# Patient Record
Sex: Female | Born: 1975 | Race: White | Hispanic: No | Marital: Married | Smoking: Never smoker
Health system: Southern US, Community
[De-identification: ages and names within clinical notes are randomized; demographics above are authoritative.]

## PROBLEM LIST (undated history)

## (undated) DIAGNOSIS — E785 Hyperlipidemia, unspecified: Secondary | ICD-10-CM

## (undated) DIAGNOSIS — J302 Other seasonal allergic rhinitis: Secondary | ICD-10-CM

## (undated) DIAGNOSIS — F419 Anxiety disorder, unspecified: Secondary | ICD-10-CM

## (undated) DIAGNOSIS — Z975 Presence of (intrauterine) contraceptive device: Secondary | ICD-10-CM

## (undated) DIAGNOSIS — J069 Acute upper respiratory infection, unspecified: Secondary | ICD-10-CM

## (undated) HISTORY — DX: Presence of (intrauterine) contraceptive device: Z97.5

## (undated) HISTORY — PX: WISDOM TOOTH EXTRACTION: SHX21

## (undated) HISTORY — DX: Anxiety disorder, unspecified: F41.9

## (undated) HISTORY — DX: Hyperlipidemia, unspecified: E78.5

## (undated) HISTORY — DX: Acute upper respiratory infection, unspecified: J06.9

## (undated) HISTORY — PX: TONSILLECTOMY AND ADENOIDECTOMY: SUR1326

## (undated) HISTORY — DX: Other seasonal allergic rhinitis: J30.2

---

## 2002-07-11 ENCOUNTER — Other Ambulatory Visit: Admission: RE | Admit: 2002-07-11 | Discharge: 2002-07-11 | Payer: Self-pay | Admitting: Obstetrics and Gynecology

## 2003-08-10 ENCOUNTER — Other Ambulatory Visit: Admission: RE | Admit: 2003-08-10 | Discharge: 2003-08-10 | Payer: Self-pay | Admitting: Obstetrics and Gynecology

## 2005-06-11 ENCOUNTER — Ambulatory Visit (HOSPITAL_COMMUNITY): Admission: RE | Admit: 2005-06-11 | Discharge: 2005-06-11 | Payer: Self-pay | Admitting: Obstetrics and Gynecology

## 2006-06-07 ENCOUNTER — Inpatient Hospital Stay (HOSPITAL_COMMUNITY): Admission: AD | Admit: 2006-06-07 | Discharge: 2006-06-07 | Payer: Self-pay | Admitting: Obstetrics and Gynecology

## 2006-07-06 ENCOUNTER — Inpatient Hospital Stay (HOSPITAL_COMMUNITY): Admission: AD | Admit: 2006-07-06 | Discharge: 2006-07-09 | Payer: Self-pay | Admitting: Obstetrics and Gynecology

## 2010-03-03 ENCOUNTER — Inpatient Hospital Stay (HOSPITAL_COMMUNITY): Admission: AD | Admit: 2010-03-03 | Discharge: 2010-03-03 | Payer: Self-pay | Admitting: Obstetrics

## 2010-04-13 ENCOUNTER — Inpatient Hospital Stay (HOSPITAL_COMMUNITY): Admission: AD | Admit: 2010-04-13 | Discharge: 2010-04-15 | Payer: Self-pay | Admitting: Obstetrics

## 2010-12-29 LAB — CBC
HCT: 32.3 % — ABNORMAL LOW (ref 36.0–46.0)
Hemoglobin: 12.3 g/dL (ref 12.0–15.0)
MCH: 33.2 pg (ref 26.0–34.0)
MCV: 95.8 fL (ref 78.0–100.0)
Platelets: 215 10*3/uL (ref 150–400)
Platelets: 249 10*3/uL (ref 150–400)
RBC: 3.37 MIL/uL — ABNORMAL LOW (ref 3.87–5.11)
RBC: 3.73 MIL/uL — ABNORMAL LOW (ref 3.87–5.11)
RDW: 12.9 % (ref 11.5–15.5)
RDW: 13.1 % (ref 11.5–15.5)
WBC: 11.7 10*3/uL — ABNORMAL HIGH (ref 4.0–10.5)
WBC: 13.3 10*3/uL — ABNORMAL HIGH (ref 4.0–10.5)

## 2010-12-30 LAB — URINE CULTURE
Colony Count: NO GROWTH
Culture: NO GROWTH

## 2010-12-30 LAB — FETAL FIBRONECTIN: Fetal Fibronectin: NEGATIVE

## 2010-12-30 LAB — URINALYSIS, ROUTINE W REFLEX MICROSCOPIC

## 2013-05-06 ENCOUNTER — Ambulatory Visit (HOSPITAL_BASED_OUTPATIENT_CLINIC_OR_DEPARTMENT_OTHER)
Admission: RE | Admit: 2013-05-06 | Discharge: 2013-05-06 | Disposition: A | Discharge status: Home / Self Care | Payer: 59 | Source: Ambulatory Visit | Attending: Family Medicine | Admitting: Family Medicine

## 2013-05-06 ENCOUNTER — Ambulatory Visit (INDEPENDENT_AMBULATORY_CARE_PROVIDER_SITE_OTHER): Payer: 59 | Admitting: Family Medicine

## 2013-05-06 ENCOUNTER — Encounter: Payer: Self-pay | Admitting: Family Medicine

## 2013-05-06 VITALS — BP 108/73 | HR 70 | Wt 112.0 lb

## 2013-05-06 DIAGNOSIS — M51379 Other intervertebral disc degeneration, lumbosacral region without mention of lumbar back pain or lower extremity pain: Secondary | ICD-10-CM | POA: Insufficient documentation

## 2013-05-06 DIAGNOSIS — Z569 Unspecified problems related to employment: Secondary | ICD-10-CM

## 2013-05-06 DIAGNOSIS — M545 Low back pain, unspecified: Secondary | ICD-10-CM | POA: Insufficient documentation

## 2013-05-06 DIAGNOSIS — M5137 Other intervertebral disc degeneration, lumbosacral region: Secondary | ICD-10-CM | POA: Insufficient documentation

## 2013-05-06 DIAGNOSIS — G4700 Insomnia, unspecified: Secondary | ICD-10-CM

## 2013-05-06 DIAGNOSIS — M549 Dorsalgia, unspecified: Secondary | ICD-10-CM

## 2013-05-06 DIAGNOSIS — Z566 Other physical and mental strain related to work: Secondary | ICD-10-CM

## 2013-05-06 DIAGNOSIS — F411 Generalized anxiety disorder: Secondary | ICD-10-CM

## 2013-05-06 MED ORDER — ZOLPIDEM TARTRATE 10 MG PO TABS: 10.0000 mg | ORAL_TABLET | Freq: Every evening | ORAL | Status: DC | PRN

## 2013-05-06 MED ORDER — ESCITALOPRAM OXALATE 5 MG PO TABS: 5.0000 mg | ORAL_TABLET | Freq: Every day | ORAL | Status: DC

## 2013-05-06 NOTE — Progress Notes (Signed)
  Subjective:    Patient ID: Hayley Reynolds, female    DOB: 1976-08-27, 37 y.o.   MRN: [id number]  HPI  Hayley Reynolds is here today to discuss a few issues:   1)  Insomnia/Stress.:  She has struggled with this problem for year.  She feels that her sleeping disturbances are due to her stress.  She feels very fatigued during the day and she feels that her cognition is adversely affected by her lack of sleep. She has tried Melatonin which has helped her some.  She has aslo take Ambien which works well for her. She feels that her insomnia may be related to her stress/anxiety.  She has noticed that her symptoms are worse when she consumes caffeine.     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  She has tried doing back exercises which have not helped her very much.   Review of Systems  Constitutional: Positive for fatigue. Negative for unexpected weight change.  HENT: Negative.   Respiratory: Negative.   Cardiovascular: Negative.   Gastrointestinal: Negative.   Genitourinary: Negative.   Musculoskeletal: Positive for back pain.  Psychiatric/Behavioral: Positive for sleep disturbance and decreased concentration. The patient is nervous/anxious.     Past Medical History  Diagnosis Date  . Anxiety   . IUD (intrauterine device) in place   . Acute upper respiratory infections of other multiple sites    Family History  Problem Relation Age of Onset  . Heart disease Mother   . Hyperlipidemia Mother   . Asthma Sister   . Heart disease Maternal Grandmother   . 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  Neck: Normal range of motion. Neck supple.  Cardiovascular: Normal rate, regular rhythm and normal heart sounds.   Pulmonary/Chest: Effort normal and breath sounds normal.  Musculoskeletal:       Lumbar back: She exhibits decreased range of motion, tenderness and spasm. She exhibits no edema and no deformity.  Neurological: She has normal reflexes. She exhibits normal muscle tone. Coordination normal.  Skin: No rash noted.  Psychiatric: Her speech is normal and behavior is normal. Judgment and thought content normal. Her mood appears anxious (Stressed ). Cognition and memory are normal.          Assessment & Plan:

## 2013-05-06 NOTE — Patient Instructions (Addendum)
1)  Anxiety - Start on Lexapro 2.5 mg for a week then increase to 1 tab.  2)  Insomnia - Take 1/2 of an Ambien occasionally as needed.    3)  Back Pain - X-ray showed mild narrowing between L4-5.  You may decide to do PT vs chiropractic care.       Back Exercises Back exercises help treat and prevent back injuries. The goal of back exercises is to increase the strength of your abdominal and back muscles and the flexibility of your back. These exercises should be started when you no longer have back pain. Back exercises include:  Pelvic Tilt. Lie on your back with your knees bent. Tilt your pelvis until the lower part of your back is against the floor. Hold this position 5 to 10 sec and repeat 5 to 10 times.  Knee to Chest. Pull first 1 knee up against your chest and hold for 20 to 30 seconds, repeat this with the other knee, and then both knees. This may be done with the other leg straight or bent, whichever feels better.  Sit-Ups or Curl-Ups. Bend your knees 90 degrees. Start with tilting your pelvis, and do a partial, slow sit-up, lifting your trunk only 30 to 45 degrees off the floor. Take at least 2 to 3 seconds for each sit-up. Do not do sit-ups with your knees out straight. If partial sit-ups are difficult, simply do the above but with only tightening your abdominal muscles and holding it as directed.  Hip-Lift. Lie on your back with your knees flexed 90 degrees. Push down with your feet and shoulders as you raise your hips a couple inches off the floor; hold for 10 seconds, repeat 5 to 10 times.  Back arches. Lie on your stomach, propping yourself up on bent elbows. Slowly press on your hands, causing an arch in your low back. Repeat 3 to 5 times. Any initial stiffness and discomfort should lessen with repetition over time.  Shoulder-Lifts. Lie face down with arms beside your body. Keep hips and torso pressed to floor as you slowly lift your head and shoulders off the floor. Do not  overdo your exercises, especially in the beginning. Exercises may cause you some mild back discomfort which lasts for a few minutes; however, if the pain is more severe, or lasts for more than 15 minutes, do not continue exercises until you see your caregiver. Improvement with exercise therapy for back problems is slow.  See your caregivers for assistance with developing a proper back exercise program. Document Released: 11/06/2004 Document Revised: 12/22/2011 Document Reviewed: 07/31/2011 [facility] Patient Information 2014 [place], [place].

## 2013-06-06 ENCOUNTER — Ambulatory Visit (INDEPENDENT_AMBULATORY_CARE_PROVIDER_SITE_OTHER): Payer: 59 | Admitting: Family Medicine

## 2013-06-06 ENCOUNTER — Encounter: Payer: Self-pay | Admitting: Family Medicine

## 2013-06-06 VITALS — BP 113/75 | HR 54 | Resp 16 | Ht 62.0 in | Wt 116.0 lb

## 2013-06-06 DIAGNOSIS — M549 Dorsalgia, unspecified: Secondary | ICD-10-CM | POA: Insufficient documentation

## 2013-06-06 DIAGNOSIS — F411 Generalized anxiety disorder: Secondary | ICD-10-CM

## 2013-06-06 DIAGNOSIS — G4700 Insomnia, unspecified: Secondary | ICD-10-CM | POA: Insufficient documentation

## 2013-06-06 DIAGNOSIS — Z566 Other physical and mental strain related to work: Secondary | ICD-10-CM | POA: Insufficient documentation

## 2013-06-06 MED ORDER — ESCITALOPRAM OXALATE 5 MG PO TABS: 5.0000 mg | ORAL_TABLET | Freq: Every day | ORAL | Status: DC

## 2013-06-06 NOTE — Progress Notes (Signed)
  Subjective:    Patient ID: Hayley Reynolds, female    DOB: 18-Apr-1976, 37 y.o.   MRN: [id number]  HPI  Hayley Reynolds is here today for a refill of her Lexapro.  Her mood is good on this medication and she would like to remain on it.     Review of Systems  Constitutional: Negative.   HENT: Negative.   Eyes: Negative.   Respiratory: Negative.   Cardiovascular: Negative.   Gastrointestinal: Negative.   Endocrine: Negative.   Genitourinary: Negative.   Musculoskeletal: Negative.   Skin: Negative.   Allergic/Immunologic: Negative.   Neurological: Negative.   Hematological: Negative.   Psychiatric/Behavioral: Negative.     Past Medical History  Diagnosis Date  . Anxiety   . IUD (intrauterine device) in place   . Acute upper respiratory infections of other multiple sites     Family History  Problem Relation Age of Onset  . Heart disease Mother   . Hyperlipidemia Mother   . Asthma Sister   . Heart disease Maternal Grandmother   . 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 She appears well-developed and well-nourished. No distress.  Cardiovascular: Normal rate, regular rhythm and normal heart sounds.   Pulmonary/Chest: Breath sounds normal.  Neurological: She is alert and oriented to person, place, and time.  Skin: Skin is warm and dry.  Psychiatric: She has a normal mood and affect.      Assessment & Plan:

## 2013-06-06 NOTE — Assessment & Plan Note (Addendum)
Hayley Reynolds has a couple of things that she needs to figure out with her work.  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 She is considering doing this full-time but is nervous about the risk with her potential income.  She is in the process of deciding what she wants to do.

## 2013-06-06 NOTE — Assessment & Plan Note (Signed)
We'll start her on Lexapro. She is to F/U in a month for a recheck.

## 2013-06-06 NOTE — Assessment & Plan Note (Signed)
She was given a prescription for Ambien for 30 days.

## 2013-06-06 NOTE — Assessment & Plan Note (Addendum)
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  Sent for an x-ray of her lumbar spine which showed mild narrowing of disk space (L4-5).  She might benefit from PT vs chiropractic adjustment.

## 2013-06-30 ENCOUNTER — Other Ambulatory Visit: Payer: Self-pay | Admitting: *Deleted

## 2013-06-30 DIAGNOSIS — Z0000 Encounter for general adult medical examination without abnormal findings: Secondary | ICD-10-CM

## 2013-06-30 DIAGNOSIS — R5381 Other malaise: Secondary | ICD-10-CM

## 2013-06-30 DIAGNOSIS — E559 Vitamin D deficiency, unspecified: Secondary | ICD-10-CM

## 2013-07-01 ENCOUNTER — Other Ambulatory Visit: Payer: 59

## 2013-07-01 LAB — COMPLETE METABOLIC PANEL WITH GFR
ALT: 13 U/L (ref 0–35)
AST: 16 U/L (ref 0–37)
Albumin: 4.2 g/dL (ref 3.5–5.2)
Alkaline Phosphatase: 49 U/L (ref 39–117)
BUN: 8 mg/dL (ref 6–23)
CO2: 31 mEq/L (ref 19–32)
Calcium: 9.2 mg/dL (ref 8.4–10.5)
Chloride: 103 mEq/L (ref 96–112)
Creat: 0.77 mg/dL (ref 0.50–1.10)
GFR, Est African American: >89 mL/min
GFR, Est Non African American: >89 mL/min
Glucose, Bld: 75 mg/dL (ref 70–99)
Potassium: 4 mEq/L (ref 3.5–5.3)
Sodium: 138 mEq/L (ref 135–145)
Total Bilirubin: 0.8 mg/dL (ref 0.3–1.2)
Total Protein: 6.7 g/dL (ref 6.0–8.3)

## 2013-07-01 LAB — CBC WITH DIFFERENTIAL/PLATELET
Basophils Absolute: 0 10*3/uL (ref 0.0–0.1)
Basophils Relative: 1 % (ref 0–1)
Eosinophils Absolute: 0.2 10*3/uL (ref 0.0–0.7)
Eosinophils Relative: 4 % (ref 0–5)
HCT: 39.9 % (ref 36.0–46.0)
Hemoglobin: 13.6 g/dL (ref 12.0–15.0)
Lymphocytes Relative: 30 % (ref 12–46)
Lymphs Abs: 1.5 10*3/uL (ref 0.7–4.0)
MCH: 30.8 pg (ref 26.0–34.0)
MCHC: 34.1 g/dL (ref 30.0–36.0)
MCV: 90.3 fL (ref 78.0–100.0)
Monocytes Absolute: 0.4 10*3/uL (ref 0.1–1.0)
Monocytes Relative: 9 % (ref 3–12)
Neutro Abs: 2.7 10*3/uL (ref 1.7–7.7)
Neutrophils Relative %: 56 % (ref 43–77)
Platelets: 249 10*3/uL (ref 150–400)
RBC: 4.42 MIL/uL (ref 3.87–5.11)
RDW: 13 % (ref 11.5–15.5)
WBC: 4.9 10*3/uL (ref 4.0–10.5)

## 2013-07-01 LAB — LIPID PANEL
Cholesterol: 190 mg/dL (ref 0–200)
HDL: 73 mg/dL (ref >39)
LDL Cholesterol: 107 mg/dL — ABNORMAL HIGH (ref 0–99)
Total CHOL/HDL Ratio: 2.6 Ratio
Triglycerides: 51 mg/dL (ref <150)
VLDL: 10 mg/dL (ref 0–40)

## 2013-07-01 LAB — VITAMIN D 25 HYDROXY (VIT D DEFICIENCY, FRACTURES): Vit D, 25-Hydroxy: 40 ng/mL (ref 30–89)

## 2013-07-01 LAB — TSH: TSH: 2.636 u[IU]/mL (ref 0.350–4.500)

## 2013-07-08 ENCOUNTER — Other Ambulatory Visit: Payer: 59

## 2013-07-15 ENCOUNTER — Encounter: Payer: 59 | Admitting: Family Medicine

## 2013-07-17 ENCOUNTER — Encounter: Payer: Self-pay | Admitting: Family Medicine

## 2013-07-25 ENCOUNTER — Encounter: Payer: 59 | Admitting: Family Medicine

## 2013-07-29 ENCOUNTER — Ambulatory Visit (INDEPENDENT_AMBULATORY_CARE_PROVIDER_SITE_OTHER): Payer: 59 | Admitting: Family Medicine

## 2013-07-29 ENCOUNTER — Other Ambulatory Visit (HOSPITAL_COMMUNITY)
Admission: RE | Admit: 2013-07-29 | Discharge: 2013-07-29 | Disposition: A | Discharge status: Home / Self Care | Payer: 59 | Source: Ambulatory Visit | Attending: Family Medicine | Admitting: Family Medicine

## 2013-07-29 ENCOUNTER — Encounter: Payer: Self-pay | Admitting: Family Medicine

## 2013-07-29 VITALS — BP 98/62 | HR 58 | Resp 16 | Ht 62.0 in | Wt 117.0 lb

## 2013-07-29 DIAGNOSIS — Z01419 Encounter for gynecological examination (general) (routine) without abnormal findings: Secondary | ICD-10-CM | POA: Insufficient documentation

## 2013-07-29 DIAGNOSIS — J309 Allergic rhinitis, unspecified: Secondary | ICD-10-CM

## 2013-07-29 DIAGNOSIS — Z1151 Encounter for screening for human papillomavirus (HPV): Secondary | ICD-10-CM | POA: Insufficient documentation

## 2013-07-29 DIAGNOSIS — Z124 Encounter for screening for malignant neoplasm of cervix: Secondary | ICD-10-CM

## 2013-07-29 DIAGNOSIS — Z0000 Encounter for general adult medical examination without abnormal findings: Secondary | ICD-10-CM

## 2013-07-29 DIAGNOSIS — Z23 Encounter for immunization: Secondary | ICD-10-CM

## 2013-07-29 LAB — POCT URINALYSIS DIPSTICK
Bilirubin, UA: NEGATIVE
Blood, UA: NEGATIVE
Glucose, UA: NEGATIVE
Ketones, UA: NEGATIVE
Leukocytes, UA: NEGATIVE
Nitrite, UA: NEGATIVE
Protein, UA: NEGATIVE
Spec Grav, UA: 1.015
Urobilinogen, UA: NEGATIVE
pH, UA: 7

## 2013-07-29 MED ORDER — FLUTICASONE PROPIONATE 50 MCG/ACT NA SUSP: 2.0000 | Freq: Every day | NASAL | Status: AC

## 2013-07-29 NOTE — Progress Notes (Signed)
Subjective:    Patient ID: Hayley Reynolds, female    DOB: August 02, 1976, 37 y.o.   MRN: [id number]  HPI  Hayley Reynolds is here today for her annual CPE with pap smear.  Overall, she feels that her health is good.  Her only concern is her weight.  She has gained a few pounds since starting on Lexcept several months ago.  She feels that she has been sleeping better since going on the Lexapro.  She has had a few episodes of increased anxiety but she feels that she has done better since starting on the Lexapro.     Review of Systems  Constitutional: Negative.   HENT: Negative.   Eyes: Negative.   Respiratory: Negative.   Cardiovascular: Negative.   Gastrointestinal: Negative.   Endocrine: Negative.   Genitourinary: Negative.   Musculoskeletal: Negative.   Skin: Negative.   Allergic/Immunologic: Negative.   Neurological: Negative.   Hematological: Negative.   Psychiatric/Behavioral: Negative.     Past Medical History  Diagnosis Date  . Anxiety   . IUD (intrauterine device) in place   . Acute upper respiratory infections of other multiple sites      Family History  Problem Relation Age of Onset  . Heart disease Mother   . Hyperlipidemia Mother   . Asthma Sister   . Heart disease Maternal Grandmother   . 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 Constitutional: She is oriented to person, place, and time. She appears well-developed and well-nourished.  HENT:  Head: Normocephalic and atraumatic.   Right Ear: External ear normal.  Left Ear: External ear normal.  Nose: Nose normal.  Mouth/Throat: Oropharynx is clear and moist.  Eyes: Conjunctivae and EOM are normal. Pupils are equal, round, and reactive to light.  Neck: Normal range of motion. No thyromegaly present.  Cardiovascular: Normal rate, regular rhythm, normal heart sounds and intact distal pulses.  Exam reveals no gallop and no friction rub.   No murmur heard. Pulmonary/Chest: Effort normal and breath sounds normal. Right breast exhibits no inverted nipple, no mass, no nipple discharge, no skin change and no tenderness. Left breast exhibits no inverted nipple, no mass, no nipple discharge, no skin change and no tenderness. Breasts are symmetrical.  Abdominal: Soft. Bowel sounds are normal. Hernia confirmed negative in the right inguinal area and confirmed negative in the left inguinal area.  Genitourinary: Vagina normal and uterus normal. Pelvic exam was performed with patient supine. There is no rash, tenderness or lesion on the right labia. There is no rash, tenderness or lesion on the left labia. No vaginal discharge found.  Musculoskeletal: Normal range of motion. She exhibits no edema and no tenderness.  Lymphadenopathy:    She has no cervical adenopathy.       Right: No inguinal adenopathy present.       Left: No inguinal adenopathy present.  Neurological:  She is alert and oriented to person, place, and time. She has normal reflexes.  Skin: Skin is warm and dry.  Psychiatric: She has a normal mood and affect. Her behavior is normal. Judgment and thought content normal.      Assessment & Plan:   Anntoinette was seen today for annual exam.  Diagnoses and associated orders for this visit:  Routine general medical examination at a health care facility - POCT urinalysis dipstick  Screening for malignant neoplasm of the cervix - Cytology - PAP  Allergic rhinitis - fluticasone (FLONASE) 50 MCG/ACT nasal spray; Place 2  sprays into the nose at bedtime.  Need for prophylactic vaccination and inoculation against influenza

## 2013-08-02 NOTE — Assessment & Plan Note (Signed)
She was given a refill for Lexapro.

## 2013-09-18 DIAGNOSIS — Z23 Encounter for immunization: Secondary | ICD-10-CM | POA: Insufficient documentation

## 2013-09-18 DIAGNOSIS — Z0000 Encounter for general adult medical examination without abnormal findings: Secondary | ICD-10-CM | POA: Insufficient documentation

## 2014-01-17 IMAGING — CR DG LUMBAR SPINE COMPLETE 4+V
5 series · 5 of 5 positions shown · non-contrast
Comparison: None.

CLINICAL DATA: Low back pain

LUMBAR SPINE - COMPLETE 4+ VIEW

[t l-spine a.p.]
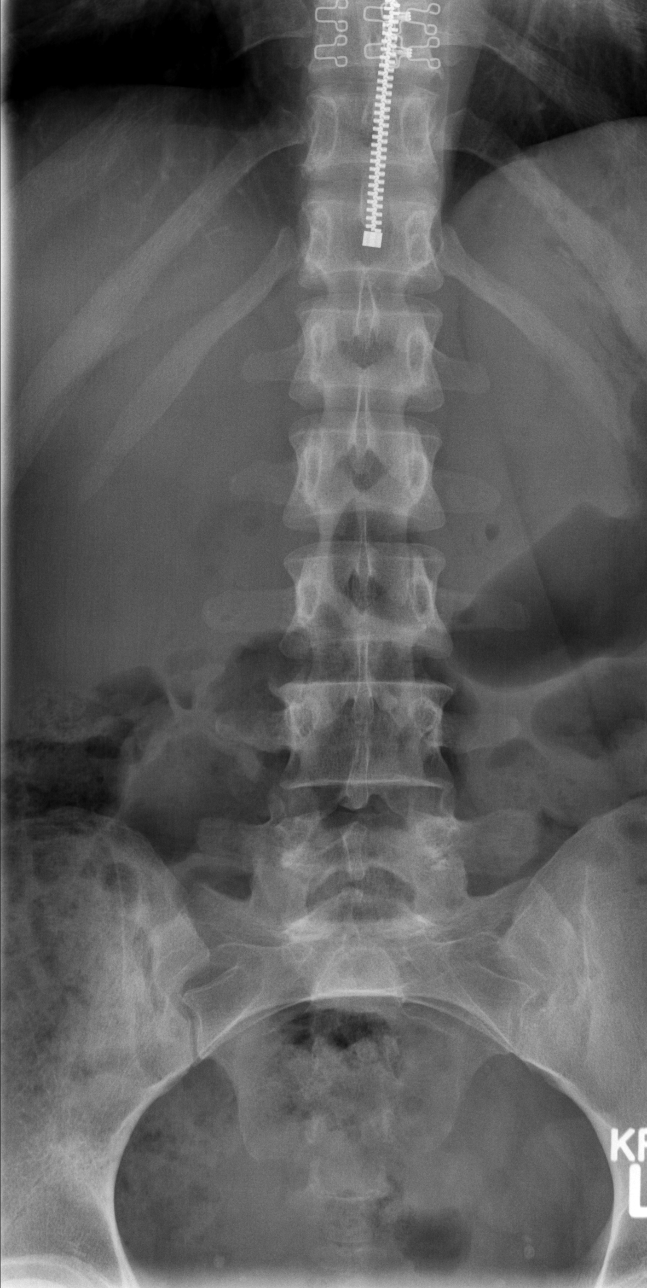

[t l-spine oblique exposure (1 of 2)]
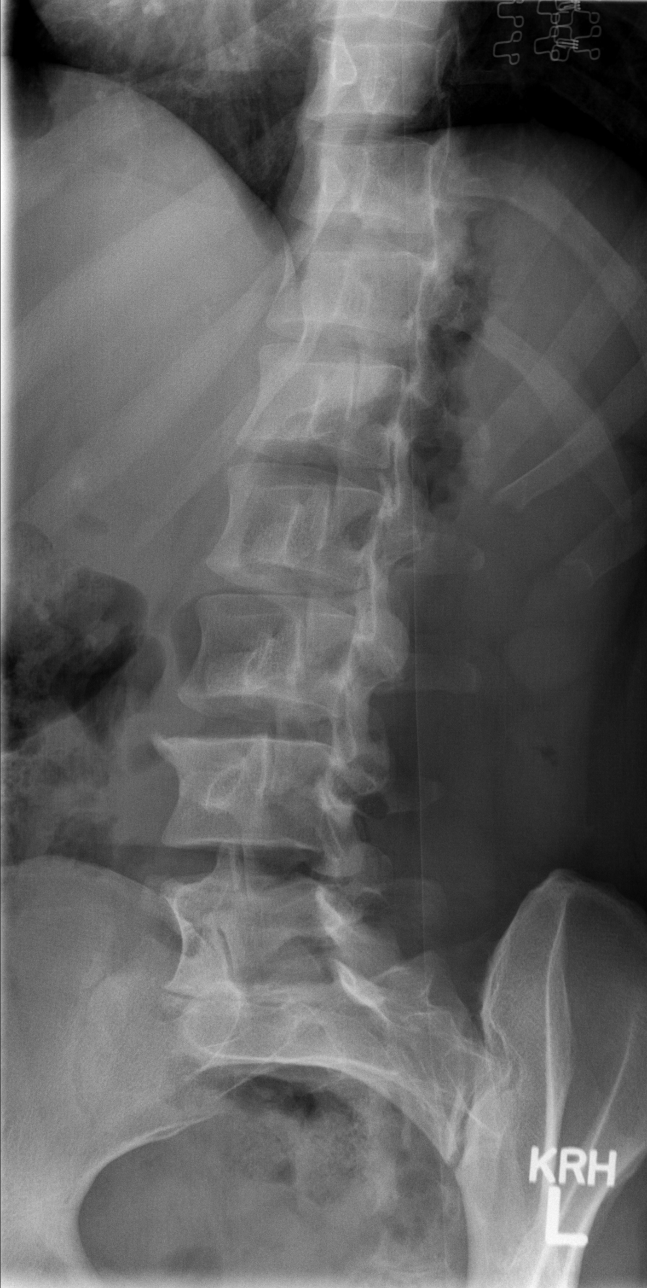

[t l-spine oblique exposure (2 of 2)]
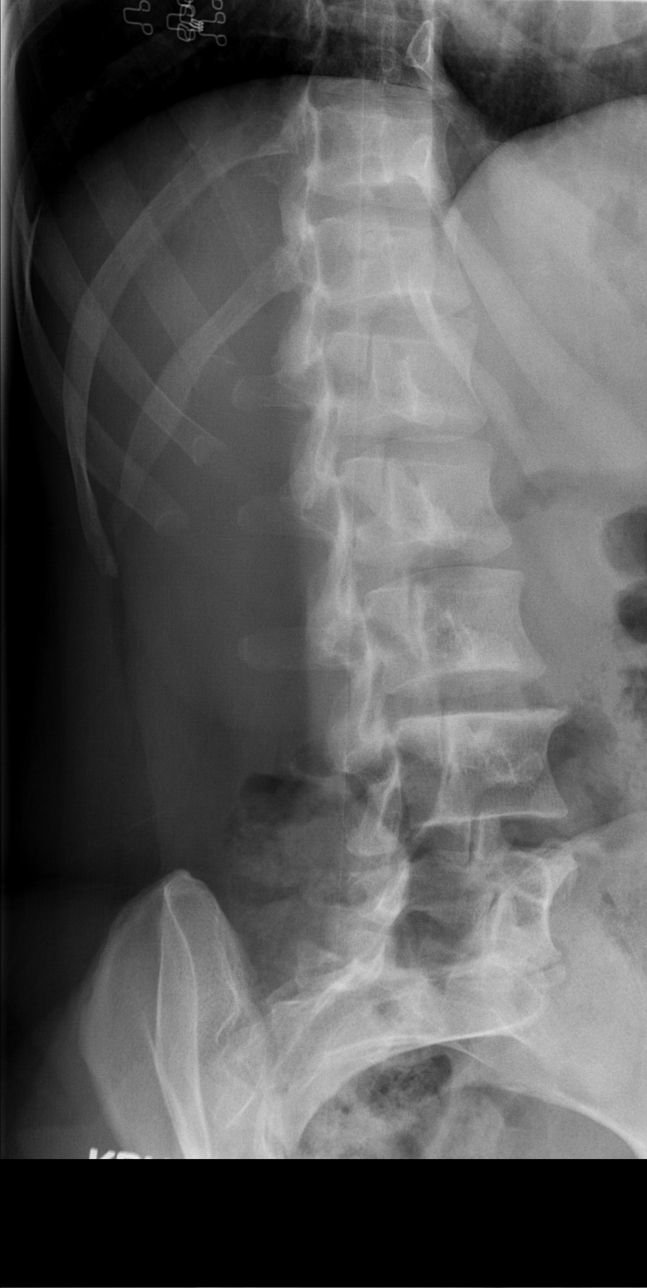

[t l-spine lat]
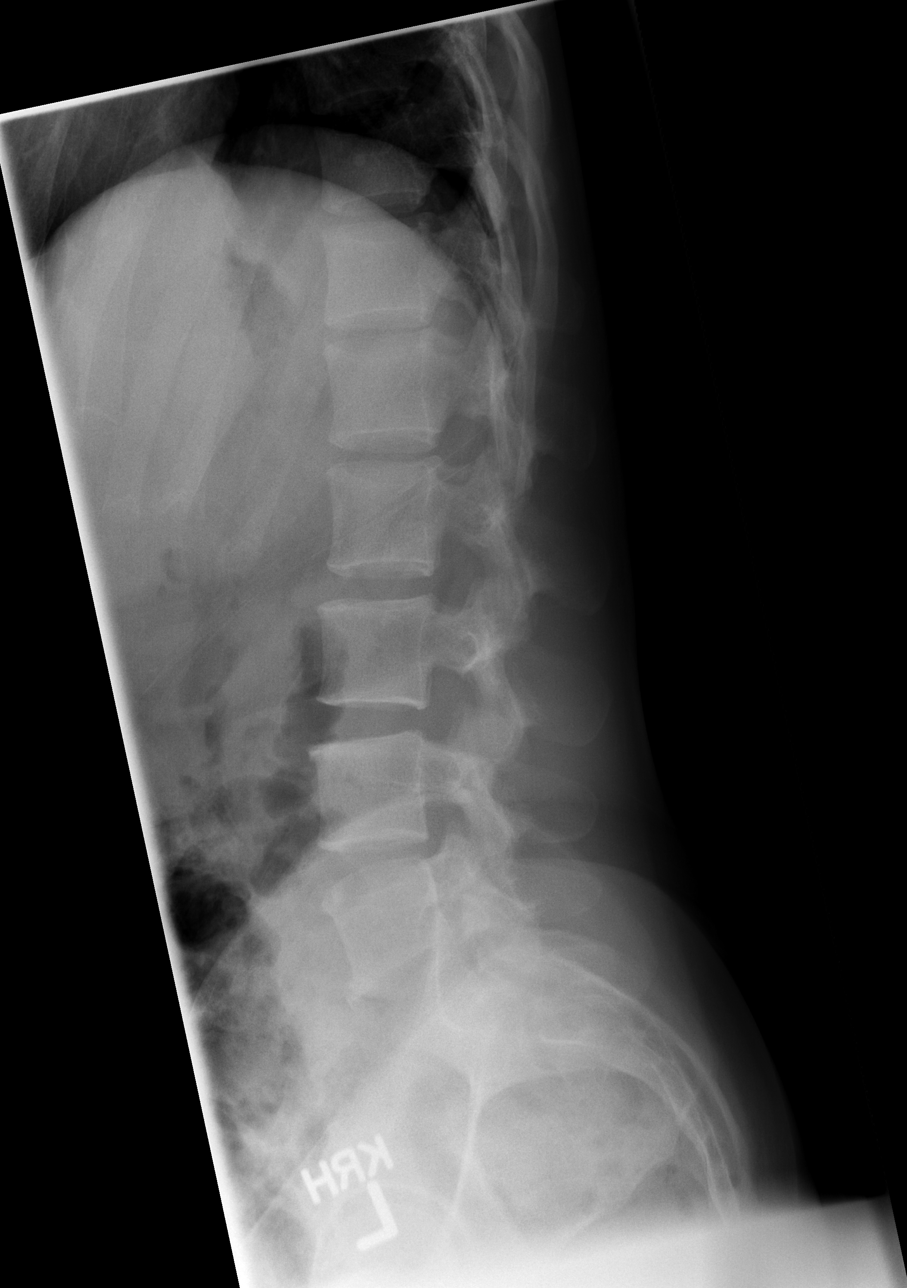

[t l-spine l5-s1 spot]
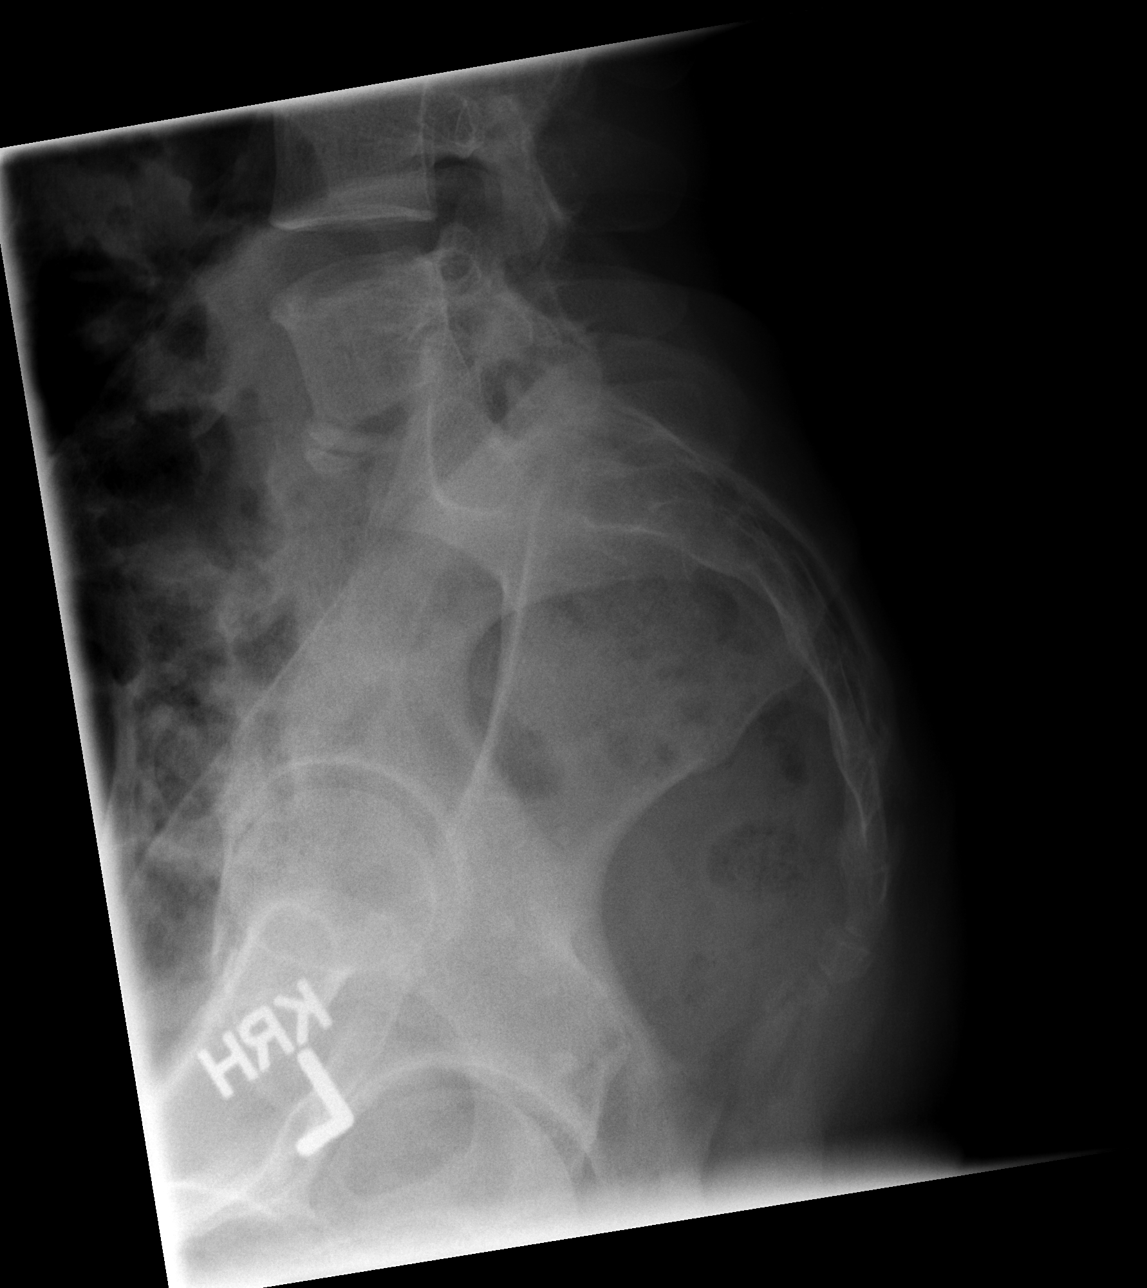

[5 of 5 positions shown; findings below may reference images not displayed]

FINDINGS: Frontal, lateral, spot lumbosacral lateral, and
bilateral oblique views were obtained.  There are five non-rib
bearing lumbar type vertebral bodies.  There is minimal
dextroscoliosis.  There is no fracture or spondylolisthesis.  There
is slight disc space narrowing at L4-L5.  Other disc spaces appear
normal.  There is no appreciable facet arthropathy.
IMPRESSION: Mild disc space narrowing at L4-5.  No fracture or
spondylolisthesis.

## 2014-02-07 ENCOUNTER — Encounter: Payer: Self-pay | Admitting: Family Medicine

## 2014-02-07 ENCOUNTER — Ambulatory Visit (INDEPENDENT_AMBULATORY_CARE_PROVIDER_SITE_OTHER): Payer: 59 | Admitting: Family Medicine

## 2014-02-07 VITALS — BP 102/68 | HR 64 | Temp 98.2°F | Resp 16 | Wt 119.0 lb

## 2014-02-07 DIAGNOSIS — A499 Bacterial infection, unspecified: Secondary | ICD-10-CM

## 2014-02-07 DIAGNOSIS — N760 Acute vaginitis: Secondary | ICD-10-CM

## 2014-02-07 DIAGNOSIS — Z3202 Encounter for pregnancy test, result negative: Secondary | ICD-10-CM

## 2014-02-07 DIAGNOSIS — R350 Frequency of micturition: Secondary | ICD-10-CM

## 2014-02-07 DIAGNOSIS — B9689 Other specified bacterial agents as the cause of diseases classified elsewhere: Secondary | ICD-10-CM

## 2014-02-07 DIAGNOSIS — M549 Dorsalgia, unspecified: Secondary | ICD-10-CM

## 2014-02-07 LAB — POCT URINALYSIS DIPSTICK
Bilirubin, UA: NEGATIVE
Blood, UA: NEGATIVE
Glucose, UA: NEGATIVE
Ketones, UA: NEGATIVE
Leukocytes, UA: NEGATIVE
Nitrite, UA: NEGATIVE
Protein, UA: NEGATIVE
Spec Grav, UA: 1.015
Urobilinogen, UA: NEGATIVE
pH, UA: 6

## 2014-02-07 LAB — POCT URINE PREGNANCY: Preg Test, Ur: NEGATIVE

## 2014-02-07 MED ORDER — METRONIDAZOLE 500 MG PO TABS: 500.0000 mg | ORAL_TABLET | Freq: Two times a day (BID) | ORAL | Status: DC

## 2014-02-07 MED ORDER — DICLOFENAC 35 MG PO CAPS: 1.0000 | ORAL_CAPSULE | Freq: Three times a day (TID) | ORAL | Status: DC

## 2014-02-07 MED ORDER — METRONIDAZOLE 0.75 % VA GEL: 1.0000 | Freq: Two times a day (BID) | VAGINAL | Status: DC

## 2014-02-07 NOTE — Progress Notes (Signed)
Subjective:    Patient ID: Hayley Reynolds, female    DOB: [birth year], 38 y.o.   MRN: [id number]  [name] is here today to discuss the condition listed below:    Pelvic Pain The patient's primary symptoms include pelvic pain. This is a new problem. The current episode started in the past 7 days. The problem has been gradually improving. The patient is experiencing no pain. She is not pregnant. Associated symptoms include flank pain, frequency and urgency. The symptoms are aggravated by intercourse.    Review of Systems  Genitourinary: Positive for urgency, frequency, flank pain and pelvic pain.    Past Medical History  Diagnosis Date  . Anxiety   . IUD (intrauterine device) in place   . Acute upper respiratory infections of other multiple sites      History reviewed. No pertinent past surgical history.   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 Heart disease Mother   . Hyperlipidemia Mother   . Asthma Sister   . Heart disease Maternal Grandmother   . Depression Paternal Grandfather      Current Outpatient Prescriptions on File Prior to Visit  Medication Sig Dispense Refill  . escitalopram (LEXAPRO) 5 MG tablet Take 1 tablet (5 mg total) by mouth daily.  90 tablet  1  . fluticasone (FLONASE) 50 MCG/ACT nasal spray Place 2 sprays into the nose at bedtime.  16 g  11  . zolpidem (AMBIEN) 10 MG tablet Take 1 tablet (10 mg total) by mouth at bedtime as needed for sleep.  30 tablet  0   No current facility-administered  medications on file prior to visit.     Allergies  Allergen Reactions  . Sulfa Antibiotics      Immunization History  Administered Date(s) Administered  . Tdap 04/14/2010      Objective:   Physical Exam  Nursing note and vitals reviewed. Constitutional: She appears well-nourished. No distress.  Abdominal: Hernia confirmed negative in the right inguinal area and confirmed negative in the left inguinal area.  Genitourinary: There is no rash, tenderness or lesion on the right labia. There is no rash, tenderness or lesion on the left labia. There is tenderness around the vagina. No erythema around the vagina. Vaginal discharge found.  Lymphadenopathy:       Right: No inguinal adenopathy present.      Assessment & Plan:    [name] was seen today for pelvic pain.  Diagnoses and associated orders for this visit:  Urinary frequency - POCT urinalysis dipstick  Pregnancy test negative - POCT urine pregnancy  Bacterial vaginosis - metroNIDAZOLE (FLAGYL) 500 MG tablet; Take 1 tablet (500 mg total) by mouth 2 (two) times daily with a meal. DO NOT CONSUME ALCOHOL WHILE TAKING THIS MEDICATION. - metroNIDAZOLE (METROGEL VAGINAL) 0.75 % vaginal gel; Place 1 Applicatorful vaginally 2 (two) times daily.  Backache - Diclofenac (ZORVOLEX) 35 MG CAPS; Take 1  capsule by mouth 3 (three) times daily.

## 2014-02-10 LAB — POCT WET PREP (WET MOUNT)

## 2014-04-03 ENCOUNTER — Telehealth: Payer: Self-pay

## 2014-04-03 NOTE — Telephone Encounter (Signed)
Hayley Reynolds called and said when she was here last she did not get a refill for her Lexapro, can we call one into [facility] for her.

## 2014-04-05 ENCOUNTER — Ambulatory Visit (INDEPENDENT_AMBULATORY_CARE_PROVIDER_SITE_OTHER): Payer: 59 | Admitting: Family Medicine

## 2014-04-05 ENCOUNTER — Encounter: Payer: Self-pay | Admitting: Family Medicine

## 2014-04-05 VITALS — BP 101/62 | HR 62 | Resp 16 | Ht 62.25 in | Wt 117.0 lb

## 2014-04-05 DIAGNOSIS — M545 Low back pain, unspecified: Secondary | ICD-10-CM

## 2014-04-05 DIAGNOSIS — F411 Generalized anxiety disorder: Secondary | ICD-10-CM

## 2014-04-05 MED ORDER — FAMOTIDINE 20 MG PO TABS: 20.0000 mg | ORAL_TABLET | Freq: Two times a day (BID) | ORAL | Status: DC

## 2014-04-05 MED ORDER — DICLOFENAC SODIUM 1 % TD GEL: 4.0000 g | Freq: Four times a day (QID) | TRANSDERMAL | Status: AC

## 2014-04-05 MED ORDER — LIDOCAINE 5 % EX PTCH: 3.0000 | MEDICATED_PATCH | Freq: Two times a day (BID) | CUTANEOUS | Status: AC

## 2014-04-05 MED ORDER — DICLOFENAC SODIUM 75 MG PO TBEC: 75.0000 mg | DELAYED_RELEASE_TABLET | Freq: Two times a day (BID) | ORAL | Status: AC

## 2014-04-05 MED ORDER — ESCITALOPRAM OXALATE 5 MG PO TABS: ORAL_TABLET | ORAL | Status: AC

## 2014-04-05 NOTE — Progress Notes (Signed)
   Subjective:    Patient ID: Hayley Reynolds, female    DOB: 11/20/1975, 38 y.o.   MRN: [id number]  HPI  Hayley Reynolds is here today to follow up on her medications. She is taking the Lexapro 2.5 mg daily and she feels like it is working well for her and she wants to remain on it.  She also needs some medications for her back pain.     Review of Systems  Constitutional: Negative for activity change, appetite change and fatigue.  Cardiovascular: Negative for chest pain, palpitations and leg swelling.  Psychiatric/Behavioral: Negative for behavioral problems. The patient is not nervous/anxious.   All other systems reviewed and are negative.    Past Medical History  Diagnosis Date  . Anxiety   . IUD (intrauterine device) in place   . Acute upper respiratory infections of other multiple sites      History reviewed. No pertinent past surgical history.   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 Heart disease Mother   . Hyperlipidemia Mother   . Asthma Sister   . Heart disease Maternal Grandmother   . Depression Paternal Grandfather      Current Outpatient Prescriptions on File Prior to Visit  Medication Sig Dispense Refill  . fluticasone (FLONASE) 50 MCG/ACT nasal spray Place 2 sprays into the nose at bedtime.  16 g  11   No current facility-administered medications on file prior to visit.     Allergies  Allergen Reactions  . Sulfa Antibiotics      Immunization History  Administered Date(s) Administered  . Tdap 04/14/2010         Objective:   Physical Exam  Vitals reviewed. Constitutional: She is oriented to person, place, and time. She appears well-developed and well-nourished. No distress.  Cardiovascular: Normal rate, regular rhythm and normal heart sounds.   Pulmonary/Chest: Breath sounds normal.  Neurological: She is alert and oriented to person, place, and time.  Skin: Skin is warm and dry.  Psychiatric: She has a normal mood and affect.      Assessment & Plan:    Hayley Reynolds was seen today for medication management.  Diagnoses and associated orders for this visit:  Anxiety state, unspecified - escitalopram (LEXAPRO) 5 MG tablet; Take 1/2 - 1 tab daily  Midline low back pain, with sciatica presence unspecified - diclofenac (VOLTAREN) 75 MG EC tablet; Take 1 tablet (75 mg total) by mouth 2 (two) times daily. - famotidine (PEPCID) 20 MG tablet; Take 1 tablet (20 mg total) by mouth 2 (two) times daily. - lidocaine (LIDODERM) 5 %; Place 3 patches onto the skin every 12 (twelve) hours. Remove & Discard patch within 12 hours or as directed by MD - diclofenac sodium (VOLTAREN) 1 % GEL; Apply 4 g topically 4 (four) times daily. To two areas of the body (Max 32 grams)

## 2021-10-13 HISTORY — PX: POLYPECTOMY: SHX149

## 2022-11-17 ENCOUNTER — Ambulatory Visit (AMBULATORY_SURGERY_CENTER): Payer: BC Managed Care – PPO

## 2022-11-17 VITALS — Ht 62.0 in | Wt 119.0 lb

## 2022-11-17 DIAGNOSIS — Z1211 Encounter for screening for malignant neoplasm of colon: Secondary | ICD-10-CM

## 2022-11-17 MED ORDER — NA SULFATE-K SULFATE-MG SULF 17.5-3.13-1.6 GM/177ML PO SOLN: 1.0000 | Freq: Once | ORAL | 0 refills | Status: AC

## 2022-11-17 NOTE — Progress Notes (Signed)
Pre visit completed via phone call; Patient verified name, DOB, and address;  No egg or soy allergy known to patient;  No issues known to pt with past sedation with any surgeries or procedures; Patient denies ever being told they had issues or difficulty with intubation;  No FH of Malignant Hyperthermia; Pt is not on diet pills; Pt is not on home 02;  Pt is not on blood thinners;  Pt denies issues with constipation;  No A fib or A flutter; Have any cardiac testing pending--NO Pt instructed to use [email or web address] or GoodRx for a price reduction on prep;   Insurance verified during PV appt=[employer]  Patient's chart reviewed by John Nulty CNRA prior to previsit and patient appropriate for the [facility].  Previsit completed and red dot placed by patient's name on their procedure day (on provider's schedule).     

## 2022-11-25 ENCOUNTER — Encounter: Payer: Self-pay | Admitting: Internal Medicine

## 2022-12-08 ENCOUNTER — Ambulatory Visit: Payer: BC Managed Care – PPO | Admitting: Internal Medicine

## 2022-12-08 ENCOUNTER — Encounter: Payer: Self-pay | Admitting: Internal Medicine

## 2022-12-08 VITALS — BP 99/68 | HR 64 | Temp 97.8°F | Resp 11 | Ht 62.0 in | Wt 119.0 lb

## 2022-12-08 DIAGNOSIS — Z1211 Encounter for screening for malignant neoplasm of colon: Secondary | ICD-10-CM | POA: Diagnosis present

## 2022-12-08 MED ORDER — SODIUM CHLORIDE 0.9 % IV SOLN: 500.0000 mL | Freq: Once | INTRAVENOUS | Status: DC

## 2022-12-08 NOTE — Op Note (Signed)
[facility] Patient Name: Hayley Reynolds Procedure Date: 12/08/2022 9:35 AM MRN: [id number] Endoscopist: Jerene Bears , MD, [id number] Age: 47 Referring MD:  Date of Birth: 01/10/1976 Gender: Female Account #: [id number] Procedure:                Colonoscopy Indications:              Screening for colorectal malignant neoplasm, This                            is the patient's first colonoscopy Medicines:                Monitored Anesthesia Care Procedure:                Pre-Anesthesia Assessment:                           - Prior to the procedure, a History and Physical                            was performed, and patient medications and                            allergies were reviewed. The patient's tolerance of                            previous anesthesia was also reviewed. The risks                            and benefits of the procedure and the sedation                            options and risks were discussed with the patient.                            All questions were answered, and informed consent                            was obtained. Prior Anticoagulants: The patient has                            taken no anticoagulant or antiplatelet agents. ASA                            Grade Assessment: II - A patient with mild systemic                            disease. After reviewing the risks and benefits,                            the patient was deemed in satisfactory condition to                            undergo the procedure.  After obtaining informed consent, the colonoscope                            was passed under direct vision. Throughout the                            procedure, the patient's blood pressure, pulse, and                            oxygen saturations were monitored continuously. The                            PCF-HQ190L Colonoscope [id number] was introduced                            through the anus and  advanced to the cecum,                            identified by palpation. The colonoscopy was                            performed without difficulty. The patient tolerated                            the procedure well. The quality of the bowel                            preparation was excellent. The ileocecal valve,                            appendiceal orifice, and rectum were photographed. Scope In: 9:49:00 AM Scope Out: 10:07:39 AM Scope Withdrawal Time: 0 hours 12 minutes 58 seconds  Total Procedure Duration: 0 hours 18 minutes 39 seconds  Findings:                 The digital rectal exam was normal.                           The entire examined colon appeared normal on direct                            and retroflexion views. Complications:            No immediate complications. Estimated Blood Loss:     Estimated blood loss: none. Impression:               - The entire examined colon is normal on direct and                            retroflexion views.                           - No specimens collected. Recommendation:           - Patient has a contact number available for  emergencies. The signs and symptoms of potential                            delayed complications were discussed with the                            patient. Return to normal activities tomorrow.                            Written discharge instructions were provided to the                            patient.                           - Resume previous diet.                           - Continue present medications.                           - Repeat colonoscopy in 10 years for screening                            purposes. Jerene Bears, MD 12/08/2022 10:10:11 AM This report has been signed electronically.

## 2022-12-08 NOTE — Progress Notes (Signed)
Vss nad trans to pacu °

## 2022-12-08 NOTE — Progress Notes (Signed)
GASTROENTEROLOGY PROCEDURE H&P NOTE   Primary Care Physician: Lorenda Hatchet, FNP    Reason for Procedure:  Colon cancer screening  Plan:    Colonoscopy  Patient is appropriate for endoscopic procedure(s) in the ambulatory ([facility]) setting.  The nature of the procedure, as well as the risks, benefits, and alternatives were carefully and thoroughly reviewed with the patient. Ample time for discussion and questions allowed. The patient understood, was satisfied, and agreed to proceed.     HPI: Hayley Reynolds is a 47 y.o. female who presents for screening colonoscopy.  Medical history as below.  Tolerated the prep.  No recent chest pain or shortness of breath.  No abdominal pain today.  Past Medical History:  Diagnosis Date   Acute upper respiratory infections of other multiple sites    Anxiety    on meds   Hyperlipidemia    on meds   IUD (intrauterine device) in place    Seasonal allergies     Past Surgical History:  Procedure Laterality Date   POLYPECTOMY  2023   uterine polyp resected   TONSILLECTOMY AND ADENOIDECTOMY     WISDOM TOOTH EXTRACTION      Prior to Admission medications   Medication Sig Start Date End Date Taking? Authorizing Provider  escitalopram (LEXAPRO) 5 MG tablet Take 1/2 - 1 tab daily Patient taking differently: Take 5 mg by mouth daily. Take 1/2 - 1 tab daily 04/05/14 12/08/22 Yes Zanard, Bernadene Bell, MD  fluticasone (FLONASE) 50 MCG/ACT nasal spray Place 2 sprays into the nose at bedtime. Patient taking differently: Place 2 sprays into the nose daily as needed. 07/29/13 12/08/22 Yes Zanard, Bernadene Bell, MD  Ginger, Zingiber officinalis, (GINGER ROOT) 550 MG CAPS Take 1 capsule by mouth daily at 6 (six) AM.   Yes [provider]  Multiple Vitamin (MULTIVITAMIN) tablet Take 1 tablet by mouth daily.   Yes [provider]  Omega-3 Fatty Acids (FISH OIL) 1200 MG CAPS Take 1 capsule by mouth daily at 6 (six) AM.   Yes [provider]  rosuvastatin (CRESTOR) 5 MG tablet Take 5 mg by mouth daily. 10/22/22  Yes [provider]  diclofenac Sodium (VOLTAREN) 1 % GEL Apply 2 g topically 4 (four) times daily as needed. 04/05/20   [provider]  Magnesium Glycinate 100 MG CAPS Take 2 capsules by mouth daily at 6 (six) AM.    [provider]  zolpidem (AMBIEN) 5 MG tablet Take 5 mg by mouth at bedtime as needed for sleep. Take 1/2 tab at bedtime PRN    [provider]    Current Outpatient Medications  Medication Sig Dispense Refill   escitalopram (LEXAPRO) 5 MG tablet Take 1/2 - 1 tab daily (Patient taking differently: Take 5 mg by mouth daily. Take 1/2 - 1 tab daily) 90 tablet 0   fluticasone (FLONASE) 50 MCG/ACT nasal spray Place 2 sprays into the nose at bedtime. (Patient taking differently: Place 2 sprays into the nose daily as needed.) 16 g 11   Ginger, Zingiber officinalis, (GINGER ROOT) 550 MG CAPS Take 1 capsule by mouth daily at 6 (six) AM.     Multiple Vitamin (MULTIVITAMIN) tablet Take 1 tablet by mouth daily.     Omega-3 Fatty Acids (FISH OIL) 1200 MG CAPS Take 1 capsule by mouth daily at 6 (six) AM.     rosuvastatin (CRESTOR) 5 MG tablet Take 5 mg by mouth daily.     diclofenac Sodium (VOLTAREN) 1 %  GEL Apply 2 g topically 4 (four) times daily as needed.     Magnesium Glycinate 100 MG CAPS Take 2 capsules by mouth daily at 6 (six) AM.     zolpidem (AMBIEN) 5 MG tablet Take 5 mg by mouth at bedtime as needed for sleep. Take 1/2 tab at bedtime PRN     Current Facility-Administered Medications  Medication Dose Route Frequency Provider Last Rate Last Admin   0.9 %  sodium chloride infusion  500 mL Intravenous Once Patrice Matthew, Lajuan Lines, MD        Allergies as of 12/08/2022 - Review Complete 12/08/2022  Allergen Reaction Noted   Sulfur Rash 10/21/2018   Sulfa antibiotics  05/02/2013   Sulfacetamide sodium  08/31/2014   Sulfasalazine Rash 05/02/2013    Family History  Problem  Relation Age of Onset   Colon polyps Mother 35   Heart disease Mother    Hyperlipidemia Mother    Colon polyps Father 65   Asthma Sister    Heart disease Maternal Grandmother    Depression Paternal Grandfather    Colon cancer Neg Hx    Esophageal cancer Neg Hx    Rectal cancer Neg Hx    Stomach cancer Neg Hx     Social History   Socioeconomic History   Marital status: Married    Spouse name: Ben   Number of children: 2   Years of education: 16+   Highest education level: Not on file  Occupational History   Occupation: OCCUPATIONAL THERAPY     Employer: [employer]  Tobacco Use   Smoking status: Never   Smokeless tobacco: Never  Vaping Use   Vaping Use: Never used  Substance and Sexual Activity   Alcohol use: Not Currently    Alcohol/week: 0.0 - 1.0 standard drinks of alcohol   Drug use: No   Sexual activity: Yes    Partners: Male  Other Topics Concern   Not on file  Social History Narrative   Marital Status: Married Oncologist)   Children:  Daughter Human resources officer) Son Therapist, sports)    Pets: Cats (2)    Living Situation: Lives with husband and children    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575 m)   Wt 119 lb (54 kg)   LMP 11/27/2022   SpO2 99%   BMI 21.77 kg/m  GEN: NAD EYE: Sclerae anicteric ENT: MMM CV: Non-tachycardic Pulm: CTA b/l GI: Soft, NT/ND NEURO:   Alert & Oriented x 3   Hayley Jarred, MD [facility]  12/08/2022 9:36 AM

## 2022-12-08 NOTE — Patient Instructions (Signed)
Your next colonoscopy should occur in 10 years.    You may resume your previous diet and medication schedule.  Thank you for allowing [place] to care for you today!!!   YOU HAD AN ENDOSCOPIC PROCEDURE TODAY AT [facility]:   Refer to the procedure report that was given to you for any specific questions about what was found during the examination.  If the procedure report does not answer your questions, please call your gastroenterologist to clarify.  If you requested that your care partner not be given the details of your procedure findings, then the procedure report has been included in a sealed envelope for you to review at your convenience later.  YOU SHOULD EXPECT: Some feelings of bloating in the abdomen. Passage of more gas than usual.  Walking can help get rid of the air that was put into your GI tract during the procedure and reduce the bloating. If you had a lower endoscopy (such as a colonoscopy or flexible sigmoidoscopy) you may notice spotting of blood in your stool or on the toilet paper. If you underwent a bowel prep for your procedure, you may not have a normal bowel movement for a few days.  Please Note:  You might notice some irritation and congestion in your nose or some drainage.  This is from the oxygen used during your procedure.  There is no need for concern and it should clear up in a day or so.  SYMPTOMS TO REPORT IMMEDIATELY:  Following lower endoscopy (colonoscopy or flexible sigmoidoscopy):  Excessive amounts of blood in the stool  Significant tenderness or worsening of abdominal pains  Swelling of the abdomen that is new, acute  Fever of 100F or higher  For urgent or emergent issues, a gastroenterologist can be reached at any hour by calling [phone]. Do not use MyChart messaging for urgent concerns.    DIET:  We do recommend a small meal at first, but then you may proceed to your regular diet.  Drink plenty of fluids but you should avoid  alcoholic beverages for 24 hours.  ACTIVITY:  You should plan to take it easy for the rest of today and you should NOT DRIVE or use heavy machinery until tomorrow (because of the sedation medicines used during the test).    FOLLOW UP: Our staff will call the number listed on your records the next business day following your procedure.  We will call around 7:15- 8:00 am to check on you and address any questions or concerns that you may have regarding the information given to you following your procedure. If we do not reach you, we will leave a message.     If any biopsies were taken you will be contacted by phone or by letter within the next 1-3 weeks.  Please call us at [phone] if you have not heard about the biopsies in 3 weeks.    SIGNATURES/CONFIDENTIALITY: You and/or your care partner have signed paperwork which will be entered into your electronic medical record.  These signatures attest to the fact that that the information above on your After Visit Summary has been reviewed and is understood.  Full responsibility of the confidentiality of this discharge information lies with you and/or your care-partner.

## 2022-12-09 ENCOUNTER — Telehealth: Payer: Self-pay

## 2022-12-09 NOTE — Telephone Encounter (Signed)
  Follow up Call-     12/08/2022    8:37 AM  Call back number  Post procedure Call Back phone  # [phone]  Permission to leave phone message Yes     Patient questions:  Do you have a fever, pain , or abdominal swelling? no Pain Score  0 *  Have you tolerated food without any problems? Yes.    Have you been able to return to your normal activities? Yes.    Do you have any questions about your discharge instructions: Diet   No. Medications  No. Follow up visit  No.  Do you have questions or concerns about your Care? No.  Actions: * If pain score is 4 or above: No action needed, pain <4.
